# Patient Record
Sex: Female | Born: 1945 | Race: White | Hispanic: No | Marital: Married | Smoking: Never smoker
Health system: Southern US, Community
[De-identification: ages and names within clinical notes are randomized; demographics above are authoritative.]

## PROBLEM LIST (undated history)

## (undated) DIAGNOSIS — I10 Essential (primary) hypertension: Secondary | ICD-10-CM

## (undated) DIAGNOSIS — M810 Age-related osteoporosis without current pathological fracture: Secondary | ICD-10-CM

## (undated) DIAGNOSIS — H269 Unspecified cataract: Secondary | ICD-10-CM

## (undated) DIAGNOSIS — N183 Chronic kidney disease, stage 3 unspecified: Secondary | ICD-10-CM

## (undated) DIAGNOSIS — T7840XA Allergy, unspecified, initial encounter: Secondary | ICD-10-CM

## (undated) DIAGNOSIS — K219 Gastro-esophageal reflux disease without esophagitis: Secondary | ICD-10-CM

## (undated) DIAGNOSIS — E079 Disorder of thyroid, unspecified: Secondary | ICD-10-CM

## (undated) DIAGNOSIS — M1990 Unspecified osteoarthritis, unspecified site: Secondary | ICD-10-CM

## (undated) DIAGNOSIS — K635 Polyp of colon: Secondary | ICD-10-CM

## (undated) DIAGNOSIS — E785 Hyperlipidemia, unspecified: Secondary | ICD-10-CM

## (undated) HISTORY — DX: Essential (primary) hypertension: I10

## (undated) HISTORY — DX: Age-related osteoporosis without current pathological fracture: M81.0

## (undated) HISTORY — PX: UPPER GASTROINTESTINAL ENDOSCOPY: SHX188

## (undated) HISTORY — PX: POLYPECTOMY: SHX149

## (undated) HISTORY — DX: Unspecified osteoarthritis, unspecified site: M19.90

## (undated) HISTORY — DX: Polyp of colon: K63.5

## (undated) HISTORY — DX: Hyperlipidemia, unspecified: E78.5

## (undated) HISTORY — DX: Allergy, unspecified, initial encounter: T78.40XA

## (undated) HISTORY — DX: Gastro-esophageal reflux disease without esophagitis: K21.9

## (undated) HISTORY — DX: Unspecified cataract: H26.9

## (undated) HISTORY — DX: Chronic kidney disease, stage 3 (moderate): N18.3

## (undated) HISTORY — PX: COLONOSCOPY: SHX174

## (undated) HISTORY — DX: Disorder of thyroid, unspecified: E07.9

## (undated) HISTORY — PX: COLONOSCOPY W/ BIOPSIES: SHX1374

## (undated) HISTORY — DX: Chronic kidney disease, stage 3 unspecified: N18.30

## (undated) HISTORY — PX: OTHER SURGICAL HISTORY: SHX169

## (undated) HISTORY — PX: TUBAL LIGATION: SHX77

---

## 1995-03-04 HISTORY — PX: OTHER SURGICAL HISTORY: SHX169

## 2011-07-08 ENCOUNTER — Other Ambulatory Visit: Payer: Self-pay | Admitting: Family Medicine

## 2011-07-08 DIAGNOSIS — Z1231 Encounter for screening mammogram for malignant neoplasm of breast: Secondary | ICD-10-CM

## 2011-07-08 DIAGNOSIS — Z780 Asymptomatic menopausal state: Secondary | ICD-10-CM

## 2011-07-30 ENCOUNTER — Ambulatory Visit: Payer: Self-pay

## 2011-07-30 ENCOUNTER — Other Ambulatory Visit: Payer: Self-pay

## 2011-08-12 ENCOUNTER — Ambulatory Visit
Admission: RE | Admit: 2011-08-12 | Discharge: 2011-08-12 | Disposition: A | Discharge status: Home / Self Care | Payer: BC Managed Care – PPO | Source: Ambulatory Visit | Attending: Family Medicine | Admitting: Family Medicine

## 2011-08-12 DIAGNOSIS — Z780 Asymptomatic menopausal state: Secondary | ICD-10-CM

## 2011-08-12 DIAGNOSIS — Z1231 Encounter for screening mammogram for malignant neoplasm of breast: Secondary | ICD-10-CM

## 2013-10-21 ENCOUNTER — Other Ambulatory Visit: Payer: Self-pay

## 2013-10-21 DIAGNOSIS — Z1231 Encounter for screening mammogram for malignant neoplasm of breast: Secondary | ICD-10-CM

## 2013-11-11 ENCOUNTER — Ambulatory Visit: Payer: BC Managed Care – PPO

## 2013-12-15 ENCOUNTER — Ambulatory Visit
Admission: RE | Admit: 2013-12-15 | Discharge: 2013-12-15 | Disposition: A | Discharge status: Home / Self Care | Payer: 59 | Source: Ambulatory Visit

## 2013-12-15 DIAGNOSIS — Z1231 Encounter for screening mammogram for malignant neoplasm of breast: Secondary | ICD-10-CM

## 2015-09-10 ENCOUNTER — Other Ambulatory Visit: Payer: Self-pay | Admitting: Nurse Practitioner

## 2015-09-10 DIAGNOSIS — Z1231 Encounter for screening mammogram for malignant neoplasm of breast: Secondary | ICD-10-CM

## 2015-10-04 ENCOUNTER — Encounter: Payer: Self-pay | Admitting: Radiology

## 2015-10-04 ENCOUNTER — Ambulatory Visit
Admission: RE | Admit: 2015-10-04 | Discharge: 2015-10-04 | Disposition: A | Discharge status: Home / Self Care | Payer: Self-pay | Source: Ambulatory Visit | Attending: Nurse Practitioner | Admitting: Nurse Practitioner

## 2015-10-04 DIAGNOSIS — Z1231 Encounter for screening mammogram for malignant neoplasm of breast: Secondary | ICD-10-CM

## 2015-10-15 ENCOUNTER — Ambulatory Visit: Payer: Self-pay

## 2016-06-16 ENCOUNTER — Encounter: Payer: Self-pay | Admitting: Gastroenterology

## 2016-07-15 ENCOUNTER — Encounter: Payer: Self-pay | Admitting: Gastroenterology

## 2016-07-15 ENCOUNTER — Ambulatory Visit (INDEPENDENT_AMBULATORY_CARE_PROVIDER_SITE_OTHER): Payer: 59 | Admitting: Gastroenterology

## 2016-07-15 ENCOUNTER — Encounter (INDEPENDENT_AMBULATORY_CARE_PROVIDER_SITE_OTHER): Payer: Self-pay

## 2016-07-15 VITALS — BP 124/70 | HR 76 | Ht 60.25 in | Wt 156.8 lb

## 2016-07-15 DIAGNOSIS — Z800 Family history of malignant neoplasm of digestive organs: Secondary | ICD-10-CM

## 2016-07-15 DIAGNOSIS — R197 Diarrhea, unspecified: Secondary | ICD-10-CM | POA: Diagnosis not present

## 2016-07-15 NOTE — Patient Instructions (Signed)
If you are age 69 or older, your body mass index should be between 23-30. Your Body mass index is 30.37 kg/m. If this is out of the aforementioned range listed, please consider follow up with your Primary Care Provider.  If you are age 60 or younger, your body mass index should be between 19-25. Your Body mass index is 30.37 kg/m. If this is out of the aformentioned range listed, please consider follow up with your Primary Care Provider.   You will be due for a recall colonoscopy in 12-2016. We will send you a reminder in the mail when it gets closer to that time.  Thank you for choosing [facility]  Dr Wilfrid Lund III

## 2016-07-15 NOTE — Progress Notes (Addendum)
[facility] Consult Note:  History: Christina Bender 07/15/2016  Referring physician: Vicenta Aly, [place]  Reason for consult/chief complaint: Colon Cancer Screening (last done Oct 2013 with Dr Earlean Shawl, hx of benign polyps per pt) and Constipation   Subjective  HPI:  This is a 71 year old woman referred by primary care for recent diarrhea. When seen by them about a month ago, she reported 2 or 3 weeks of persistent loose stool that seemed to occur after her husband had also had a diarrheal illness. There was no rectal bleeding, she was having some crampy abdominal pain before BMs. Because of these symptoms and also a need for a screening colonoscopy later this year, the patient was referred to [place]. She now reports that her diarrhea and abdominal pain completely resolved. Her appetite is good and her weight stable. Her last colonoscopy was reportedly in October 2013 with Dr.Medoff, the report of which is not available today.  She does not know if there were polyps found, but she was told to return in 5 years because of her family history of colon cancer.  ROS:  Review of Systems  Constitutional: Negative for appetite change and unexpected weight change.  HENT: Negative for mouth sores and voice change.   Eyes: Negative for pain and redness.  Respiratory: Negative for cough and shortness of breath.   Cardiovascular: Negative for chest pain and palpitations.  Genitourinary: Negative for dysuria and hematuria.  Musculoskeletal: Negative for arthralgias and myalgias.  Skin: Negative for pallor and rash.  Allergic/Immunologic: Positive for environmental allergies.  Neurological: Negative for weakness and headaches.  Hematological: Negative for adenopathy.     Past Medical History: Past Medical History:  Diagnosis Date  . Chronic kidney disease (CKD), stage III (moderate)   . Colon polyps    benign  . Hyperlipidemia   . Hypertension   . Thyroid disorder       Past Surgical History: Past Surgical History:  Procedure Laterality Date  . cervical cone biospy  1997   benign  . COLONOSCOPY W/ BIOPSIES  2005, 2013     Family History: Family History  Problem Relation Age of Onset  . Colon cancer Mother        dx in her 15's  . Heart disease Mother   . Diabetes Mother   . Kidney disease Sister   . Kidney disease Brother   . Kidney disease Sister     Social History: Social History   Social History  . Marital status: Married    Spouse name: N/A  . Number of children: 1  . Years of education: N/A   Occupational History  . retired    Social History Main Topics  . Smoking status: Never Smoker  . Smokeless tobacco: Never Used  . Alcohol use No  . Drug use: No  . Sexual activity: Not Asked   Other Topics Concern  . None   Social History Narrative  . None    Allergies: No Known Allergies  Outpatient Meds: Current Outpatient Prescriptions  Medication Sig Dispense Refill  . atorvastatin (LIPITOR) 40 MG tablet Take 40 mg by mouth daily.    Marland Kitchen levothyroxine (SYNTHROID, LEVOTHROID) 100 MCG tablet Take 100 mcg by mouth daily before breakfast.    . lisinopril-hydrochlorothiazide (PRINZIDE,ZESTORETIC) 20-12.5 MG tablet Take 1 tablet by mouth daily.    . metoprolol tartrate (LOPRESSOR) 50 MG tablet Take 50 mg by mouth 2 (two) times daily.    . ranitidine (ZANTAC) 150 MG tablet Take 150  mg by mouth daily as needed for heartburn.     No current facility-administered medications for this visit.       ___________________________________________________________________ Objective   Exam:  BP 124/70   Pulse 76   Ht 5' 0.25" (1.53 m)   Wt 156 lb 12.8 oz (71.1 kg)   BMI 30.37 kg/m    General: this is a(n) Well-appearing woman   Eyes: sclera anicteric, no redness  ENT: oral mucosa moist without lesions, no cervical or supraclavicular lymphadenopathy, good dentition  CV: RRR without murmur, S1/S2, no JVD, no peripheral  edema  Resp: clear to auscultation bilaterally, normal RR and effort noted  GI: soft, no tenderness, with active bowel sounds. No guarding or palpable organomegaly noted.  Skin; warm and dry, no rash or jaundice noted  Neuro: awake, alert and oriented x 3. Normal gross motor function and fluent speech  Labs:  Primary care records reveal lab results from 06/12/2016: Normal CBC, normal CMP except creatinine 1.23   Assessment: Encounter Diagnoses  Name Primary?  . Diarrhea of presumed infectious origin Yes  . Family history of colon cancer in mother     Her recent abdominal pain and diarrhea have since resolved. It seems like she probably had a prolonged postinfectious diarrhea.  She is due for colonoscopy in October of this year at a 5 year interval from the last one because of her family history of colon cancer and a mother who was diagnosed under age 55.  Plan:  We have set assistant reminder to call her in several months to schedule a colonoscopy. She was also given my business card in case she has the need to see me sooner.  Thank you for the courtesy of this consult.  Please call me with any questions or concerns.  Nelida Meuse III  CC: Vicenta Aly, [place]   [facility] records show colonoscopy 12/25/11  Complete exam, excellent prep, at least one 43mm tubular adenoma, 2 diminutive polyps ablated, one or two other diminutive with "suggestive of early adenomatous change".  Recall for 12/2016 appropriate, which was the plan during recent office visit.  Wilfrid Lund, MD

## 2016-10-02 ENCOUNTER — Encounter: Payer: Self-pay | Admitting: Gastroenterology

## 2016-10-28 ENCOUNTER — Encounter: Payer: Self-pay | Admitting: Gastroenterology

## 2016-10-31 ENCOUNTER — Other Ambulatory Visit: Payer: Self-pay | Admitting: Nurse Practitioner

## 2016-10-31 DIAGNOSIS — M8580 Other specified disorders of bone density and structure, unspecified site: Secondary | ICD-10-CM

## 2016-10-31 DIAGNOSIS — Z1231 Encounter for screening mammogram for malignant neoplasm of breast: Secondary | ICD-10-CM

## 2016-11-14 ENCOUNTER — Ambulatory Visit: Payer: 59

## 2016-11-14 ENCOUNTER — Other Ambulatory Visit: Payer: 59

## 2016-11-25 ENCOUNTER — Ambulatory Visit
Admission: RE | Admit: 2016-11-25 | Discharge: 2016-11-25 | Disposition: A | Discharge status: Home / Self Care | Payer: 59 | Source: Ambulatory Visit | Attending: Nurse Practitioner | Admitting: Nurse Practitioner

## 2016-11-25 DIAGNOSIS — M8580 Other specified disorders of bone density and structure, unspecified site: Secondary | ICD-10-CM

## 2016-11-25 DIAGNOSIS — Z1231 Encounter for screening mammogram for malignant neoplasm of breast: Secondary | ICD-10-CM

## 2016-12-12 ENCOUNTER — Ambulatory Visit (AMBULATORY_SURGERY_CENTER): Payer: Self-pay

## 2016-12-12 ENCOUNTER — Encounter: Payer: Self-pay | Admitting: Gastroenterology

## 2016-12-12 VITALS — Ht 61.0 in | Wt 154.8 lb

## 2016-12-12 DIAGNOSIS — Z860100 Personal history of colon polyps, unspecified: Secondary | ICD-10-CM

## 2016-12-12 MED ORDER — SUPREP BOWEL PREP KIT 17.5-3.13-1.6 GM/177ML PO SOLN: 1.0000 | Freq: Once | ORAL | 0 refills | Status: AC

## 2016-12-12 NOTE — Progress Notes (Signed)
No diet meds No allergies to eggs or soy No home oxygen No past problems with anesthesia  Declined emmi 

## 2016-12-26 ENCOUNTER — Encounter: Payer: 59 | Admitting: Gastroenterology

## 2017-01-28 ENCOUNTER — Telehealth: Payer: Self-pay | Admitting: Gastroenterology

## 2017-01-29 ENCOUNTER — Telehealth: Payer: Self-pay | Admitting: Gastroenterology

## 2017-01-29 ENCOUNTER — Encounter: Payer: 59 | Admitting: Gastroenterology

## 2017-01-29 NOTE — Telephone Encounter (Signed)
Yes, thank you.  Dr. Silverio Decamp heard from patient and let me know.  I have sent a message to Almyra Free about it as well.

## 2017-01-29 NOTE — Telephone Encounter (Signed)
Sorry to hear she had such trouble.  Christina Bender, Please contact patient and see if she would like to try another time with a Ducolax/Miralax prep.  - HD

## 2017-01-29 NOTE — Telephone Encounter (Signed)
Oncall Note Patient's husband called first to discuss his concerns regarding the prep as she has CKD, reassured patient and her husband and advised to proceed with prep as instructed.  I got a call back in 1 hour by husband saying that patient didn't tolerate the prep and she vomited everything. Didn't want to proceed with colonoscopy, will call back to reschedule. Refused antiemetics.

## 2017-01-30 NOTE — Telephone Encounter (Signed)
Left message for patient today, asked her to call back to let us know if she is interested in rescheduling colonoscopy, would change to a different prep.

## 2017-02-04 NOTE — Telephone Encounter (Signed)
I have not heard back from patient re: rescheduling colonoscopy, mailed follow up letter today. Asked to call office if she is interested in scheduling colonoscopy with different prep.

## 2017-03-20 ENCOUNTER — Encounter: Payer: Self-pay | Admitting: *Deleted

## 2017-03-20 ENCOUNTER — Ambulatory Visit (AMBULATORY_SURGERY_CENTER): Payer: Self-pay | Admitting: *Deleted

## 2017-03-20 ENCOUNTER — Other Ambulatory Visit: Payer: Self-pay

## 2017-03-20 ENCOUNTER — Telehealth: Payer: Self-pay | Admitting: *Deleted

## 2017-03-20 VITALS — Ht 62.0 in | Wt 157.0 lb

## 2017-03-20 DIAGNOSIS — Z800 Family history of malignant neoplasm of digestive organs: Secondary | ICD-10-CM

## 2017-03-20 DIAGNOSIS — Z86010 Personal history of colonic polyps: Secondary | ICD-10-CM

## 2017-03-20 MED ORDER — NA SULFATE-K SULFATE-MG SULF 17.5-3.13-1.6 GM/177ML PO SOLN: 1.0000 [IU] | Freq: Once | ORAL | 0 refills | Status: AC

## 2017-03-20 NOTE — Telephone Encounter (Signed)
Called [facility] and spoke with Maudie Mercury.  Cancelled patient's Rx. For Suprep as she was changed to Miralax prep.  Patient could not tolerate the Suprep and was rescheduled.  Patient states that Almyra Free said we could give her Miralax prep instead.  I did not see anything stating this but I changed prep to Miralax after Suprep was already sent.  B.Simuel Stebner, CMA  PV

## 2017-03-20 NOTE — Progress Notes (Signed)
No egg or soy allergy known to patient  No issues with past sedation with any surgeries  or procedures, no intubation problems  No diet pills per patient No home 02 use per patient  No blood thinners per patient  Pt denies issues with constipation  No A fib or A flutter  EMMI video sent to pt's e mail pt declined   

## 2017-03-26 ENCOUNTER — Encounter: Payer: Self-pay | Admitting: Gastroenterology

## 2017-04-09 ENCOUNTER — Other Ambulatory Visit: Payer: Self-pay

## 2017-04-09 ENCOUNTER — Ambulatory Visit (AMBULATORY_SURGERY_CENTER): Payer: 59 | Admitting: Gastroenterology

## 2017-04-09 ENCOUNTER — Encounter: Payer: Self-pay | Admitting: Gastroenterology

## 2017-04-09 VITALS — BP 103/54 | HR 52 | Temp 97.8°F | Resp 14 | Ht 62.0 in | Wt 157.0 lb

## 2017-04-09 DIAGNOSIS — D123 Benign neoplasm of transverse colon: Secondary | ICD-10-CM | POA: Diagnosis not present

## 2017-04-09 DIAGNOSIS — Z86010 Personal history of colonic polyps: Secondary | ICD-10-CM | POA: Diagnosis not present

## 2017-04-09 MED ORDER — SODIUM CHLORIDE 0.9 % IV SOLN: 500.0000 mL | INTRAVENOUS | Status: DC

## 2017-04-09 MED ORDER — FLEET ENEMA 7-19 GM/118ML RE ENEM
1.0000 | ENEMA | Freq: Once | RECTAL | Status: AC
  Administered 2017-04-09: 1 via RECTAL

## 2017-04-09 NOTE — Progress Notes (Signed)
Report given to PACU, vss 

## 2017-04-09 NOTE — Patient Instructions (Signed)
YOU HAD AN ENDOSCOPIC PROCEDURE TODAY AT [facility]:   Refer to the procedure report that was given to you for any specific questions about what was found during the examination.  If the procedure report does not answer your questions, please call your gastroenterologist to clarify.  If you requested that your care partner not be given the details of your procedure findings, then the procedure report has been included in a sealed envelope for you to review at your convenience later.  YOU SHOULD EXPECT: Some feelings of bloating in the abdomen. Passage of more gas than usual.  Walking can help get rid of the air that was put into your GI tract during the procedure and reduce the bloating. If you had a lower endoscopy (such as a colonoscopy or flexible sigmoidoscopy) you may notice spotting of blood in your stool or on the toilet paper. If you underwent a bowel prep for your procedure, you may not have a normal bowel movement for a few days.  Please Note:  You might notice some irritation and congestion in your nose or some drainage.  This is from the oxygen used during your procedure.  There is no need for concern and it should clear up in a day or so.  SYMPTOMS TO REPORT IMMEDIATELY:   Following lower endoscopy (colonoscopy or flexible sigmoidoscopy):  Excessive amounts of blood in the stool  Significant tenderness or worsening of abdominal pains  Swelling of the abdomen that is new, acute  Fever of 100F or higher   For urgent or emergent issues, a gastroenterologist can be reached at any hour by calling [phone].   DIET:  We do recommend a small meal at first, but then you may proceed to your regular diet.  Drink plenty of fluids but you should avoid alcoholic beverages for 24 hours.  ACTIVITY:  You should plan to take it easy for the rest of today and you should NOT DRIVE or use heavy machinery until tomorrow (because of the sedation medicines used during the test).     FOLLOW UP: Our staff will call the number listed on your records the next business day following your procedure to check on you and address any questions or concerns that you may have regarding the information given to you following your procedure. If we do not reach you, we will leave a message.  However, if you are feeling well and you are not experiencing any problems, there is no need to return our call.  We will assume that you have returned to your regular daily activities without incident.  If any biopsies were taken you will be contacted by phone or by letter within the next 1-3 weeks.  Please call us at [phone] if you have not heard about the biopsies in 3 weeks.    SIGNATURES/CONFIDENTIALITY: You and/or your care partner have signed paperwork which will be entered into your electronic medical record.  These signatures attest to the fact that that the information above on your After Visit Summary has been reviewed and is understood.  Full responsibility of the confidentiality of this discharge information lies with you and/or your care-partner.  We will see you for another colonoscopy in 5 years due to your Mom's history.

## 2017-04-09 NOTE — Progress Notes (Signed)
Enema results yellow translucent liquid. Informed Dr. Loletha Carrow.

## 2017-04-09 NOTE — Progress Notes (Addendum)
Pt's states no medical or surgical changes since previsit or office visit.   Patient stating she did vomit after completing evening prep. Patient's husband called Dr Christina Bender last evening. Dr Christina Bender stating she may need an enema. Patient completed the prep with brown results. Discussed with Dr. Loletha Carrow, enema ordered.

## 2017-04-09 NOTE — Op Note (Signed)
[facility] Patient Name: Christina Bender Procedure Date: 04/09/2017 8:38 AM MRN: [id number] Endoscopist: [place]. Loletha Carrow , MD Age: 72 Referring MD:  Date of Birth: [birth year] Gender: Female Account #: [id number] Procedure:                Colonoscopy Indications:              Surveillance: Personal history of adenomatous                            polyps on last colonoscopy 5 years ago (11mm TA                            12/2011) Medicines:                Monitored Anesthesia Care Procedure:                Pre-Anesthesia Assessment:                           - Prior to the procedure, a History and Physical                            was performed, and patient medications and                            allergies were reviewed. The patient's tolerance of                            previous anesthesia was also reviewed. The risks                            and benefits of the procedure and the sedation                            options and risks were discussed with the patient.                            All questions were answered, and informed consent                            was obtained. Prior Anticoagulants: The patient has                            taken no previous anticoagulant or antiplatelet                            agents. ASA Grade Assessment: II - A patient with                            mild systemic disease. After reviewing the risks                            and benefits, the patient was deemed in  satisfactory condition to undergo the procedure.                           After obtaining informed consent, the colonoscope                            was passed under direct vision. Throughout the                            procedure, the patient's blood pressure, pulse, and                            oxygen saturations were monitored continuously. The                            Model CF-HQ190L [id number] [id number]) scope was introduced                  through the anus and advanced to the the cecum,                            identified by appendiceal orifice and ileocecal                            valve. The colonoscopy was performed without                            difficulty. The patient tolerated the procedure                            well. The quality of the bowel preparation was                            good. The ileocecal valve, appendiceal orifice, and                            rectum were photographed. The quality of the bowel                            preparation was evaluated using the BBPS [facility]                            Bowel Preparation Scale) with scores of: Right                            Colon = 2, Transverse Colon = 3 and Left Colon = 2.                            The total BBPS score equals 7. After lavage. The                            bowel preparation used was Ducolax/Miralax (patient  previously did not tolerate Suprep). Scope In: 8:49:49 AM Scope Out: 9:04:21 AM Scope Withdrawal Time: 0 hours 11 minutes 53 seconds  Total Procedure Duration: 0 hours 14 minutes 32 seconds  Findings:                 The perianal and digital rectal examinations were                            normal.                           A 4 mm polyp was found in the mid transverse colon.                            The polyp was sessile. The polyp was removed with a                            cold snare. Resection and retrieval were complete.                           Retroflexion in the rectum was not performed due to                            anatomy.                           The exam was otherwise without abnormality. Complications:            No immediate complications. Estimated Blood Loss:     Estimated blood loss was minimal. Impression:               - One 4 mm polyp in the mid transverse colon,                            removed with a cold snare. Resected and retrieved.                            - The examination was otherwise normal. Recommendation:           - Patient has a contact number available for                            emergencies. The signs and symptoms of potential                            delayed complications were discussed with the                            patient. Return to normal activities tomorrow.                            Written discharge instructions were provided to the                            patient.                           -  Resume previous diet.                           - Continue present medications.                           - Await pathology results.                           - Repeat colonoscopy in 5 years (even if polyp not                            adenomatous, patient has a FAMILY HISTORY OF COLON                            CANCER IN HER MOTHER) . Henry L. Loletha Carrow, MD 04/09/2017 9:10:45 AM This report has been signed electronically.

## 2017-04-09 NOTE — Progress Notes (Signed)
Called to room to assist during endoscopic procedure.  Patient ID and intended procedure confirmed with present staff. Received instructions for my participation in the procedure from the performing physician.  

## 2017-04-10 ENCOUNTER — Telehealth: Payer: Self-pay | Admitting: *Deleted

## 2017-04-10 NOTE — Telephone Encounter (Signed)
  Follow up Call-  Call back number 04/09/2017  Post procedure Call Back phone  # [phone]  Permission to leave phone message Yes  Some recent data might be hidden     Patient questions:  Do you have a fever, pain , or abdominal swelling? No. Pain Score  0 *  Have you tolerated food without any problems? Yes.    Have you been able to return to your normal activities? Yes.    Do you have any questions about your discharge instructions: Diet   No. Medications  No. Follow up visit  No.  Do you have questions or concerns about your Care? No.  Actions: * If pain score is 4 or above: No action needed, pain <4.

## 2017-04-14 ENCOUNTER — Encounter: Payer: Self-pay | Admitting: Gastroenterology

## 2017-10-26 ENCOUNTER — Other Ambulatory Visit: Payer: Self-pay | Admitting: Nurse Practitioner

## 2017-10-26 DIAGNOSIS — Z1231 Encounter for screening mammogram for malignant neoplasm of breast: Secondary | ICD-10-CM

## 2017-11-30 ENCOUNTER — Ambulatory Visit
Admission: RE | Admit: 2017-11-30 | Discharge: 2017-11-30 | Disposition: A | Discharge status: Home / Self Care | Payer: 59 | Source: Ambulatory Visit | Attending: Nurse Practitioner | Admitting: Nurse Practitioner

## 2017-11-30 DIAGNOSIS — Z1231 Encounter for screening mammogram for malignant neoplasm of breast: Secondary | ICD-10-CM

## 2018-01-05 ENCOUNTER — Encounter: Payer: Self-pay | Admitting: Gastroenterology

## 2018-01-05 ENCOUNTER — Ambulatory Visit (INDEPENDENT_AMBULATORY_CARE_PROVIDER_SITE_OTHER): Payer: 59 | Admitting: Gastroenterology

## 2018-01-05 VITALS — BP 130/68 | HR 60 | Ht 62.0 in | Wt 144.0 lb

## 2018-01-05 DIAGNOSIS — R195 Other fecal abnormalities: Secondary | ICD-10-CM

## 2018-01-05 NOTE — Progress Notes (Signed)
      GI Progress Note  Chief Complaint: Heme positive stool  Subjective  History:  Christina Bender sees me at the request of primary care for 2 separate stool cards positive for occult blood.  These were apparently done for colon cancer screening at the time of recent routine health maintenance physical, though she had had a colonoscopy with me earlier this year. Christina Bender denies heartburn, dysphagia, nausea, vomiting, early satiety or weight loss.  She denies change in bowel habits or rectal bleeding.  Colonoscopy Feb 2019 with < 78mm tubular adenoma  ROS: Cardiovascular:  no chest pain Respiratory: no dyspnea  The patient's Past Medical, Family and Social History were reviewed and are on file in the EMR.  Objective:  Med list reviewed  Current Outpatient Medications:  .  acetaminophen (TYLENOL) 500 MG tablet, Take 500 mg by mouth every 6 (six) hours as needed., Disp: , Rfl:  .  atorvastatin (LIPITOR) 40 MG tablet, Take 40 mg by mouth daily., Disp: , Rfl:  .  cetirizine (ZYRTEC) 10 MG tablet, Take 10 mg by mouth as needed for allergies., Disp: , Rfl:  .  cholecalciferol (VITAMIN D) 1000 units tablet, Take 1,000 Units by mouth daily., Disp: , Rfl:  .  Diclofenac Sodium (PENNSAID TD), Place onto the skin. Knee pain This med is liquid, Disp: , Rfl:  .  levothyroxine (SYNTHROID, LEVOTHROID) 100 MCG tablet, Take 100 mcg by mouth daily before breakfast., Disp: , Rfl:  .  lisinopril-hydrochlorothiazide (PRINZIDE,ZESTORETIC) 20-12.5 MG tablet, Take 1 tablet by mouth daily., Disp: , Rfl:  .  metoprolol tartrate (LOPRESSOR) 50 MG tablet, Take 50 mg by mouth 2 (two) times daily., Disp: , Rfl:  .  Multiple Vitamin (MULTIVITAMIN) tablet, Take 1 tablet by mouth daily., Disp: , Rfl:  .  naproxen sodium (ANAPROX) 220 MG tablet, Take 220 mg by mouth 2 (two) times daily with a meal. Once weekly, Disp: , Rfl:  .  ranitidine (ZANTAC) 150 MG tablet, Take 150 mg by mouth daily as needed for heartburn.,  Disp: , Rfl:   Current Facility-Administered Medications:  .  0.9 %  sodium chloride infusion, 500 mL, Intravenous, Continuous, Danis, Estill Cotta III, MD   Vital signs in last 24 hrs: Vitals:   01/05/18 1324  BP: 130/68  Pulse: 60    Physical Exam    HEENT: sclera anicteric, oral mucosa moist without lesions  Neck: supple, no thyromegaly, JVD or lymphadenopathy  Cardiac: RRR without murmurs, S1S2 heard, no peripheral edema  Pulm: clear to auscultation bilaterally, normal RR and effort noted  Abdomen: soft, no tenderness, with active bowel sounds. No guarding or palpable hepatosplenomegaly.  Skin; warm and dry, no jaundice or rash  Recent Labs:  Normal CBC 10/30/17 Pos stool card 11/04/17   @ASSESSMENTPLANBEGIN @ Assessment: Encounter Diagnosis  Name Primary?  . Heme positive stool Yes    Plan: Heme positive stool without localizing symptoms or anemia.  We discussed how this could be false positive, but we really should assume there is a potential upper GI source. I recommended upper endoscopy, and she is agreeable after discussion of procedure and risks.  Going forward, she does not need stool heme testing at routine health physicals.   Total time 25 minutes, over half spent face-to-face with patient in counseling and coordination of care.   Nelida Meuse III

## 2018-01-05 NOTE — Patient Instructions (Signed)
If you are age 70 or older, your body mass index should be between 23-30. Your Body mass index is 26.34 kg/m. If this is out of the aforementioned range listed, please consider follow up with your Primary Care Provider.  If you are age 28 or younger, your body mass index should be between 19-25. Your Body mass index is 26.34 kg/m. If this is out of the aformentioned range listed, please consider follow up with your Primary Care Provider.   You have been scheduled for an endoscopy. Please follow written instructions given to you at your visit today. If you use inhalers (even only as needed), please bring them with you on the day of your procedure. Your physician has requested that you go to www.startemmi.com and enter the access code given to you at your visit today. This web site gives a general overview about your procedure. However, you should still follow specific instructions given to you by our office regarding your preparation for the procedure.  It was a pleasure to see you today!  Dr. Loletha Carrow

## 2018-01-12 ENCOUNTER — Telehealth: Payer: Self-pay | Admitting: Gastroenterology

## 2018-01-12 ENCOUNTER — Encounter: Payer: Medicare Other | Admitting: Gastroenterology

## 2018-01-12 NOTE — Telephone Encounter (Signed)
Patient woke up this am with a scratchy throat and congestion she asked to reschedule her EGD for next week on 01/22/18 Sm

## 2018-01-22 ENCOUNTER — Ambulatory Visit (AMBULATORY_SURGERY_CENTER): Payer: Medicare Other | Admitting: Gastroenterology

## 2018-01-22 ENCOUNTER — Encounter: Payer: Self-pay | Admitting: Gastroenterology

## 2018-01-22 ENCOUNTER — Encounter: Payer: Medicare Other | Admitting: Gastroenterology

## 2018-01-22 VITALS — BP 120/57 | HR 60 | Temp 97.7°F | Resp 16 | Ht 62.0 in | Wt 144.0 lb

## 2018-01-22 DIAGNOSIS — R195 Other fecal abnormalities: Secondary | ICD-10-CM

## 2018-01-22 MED ORDER — SODIUM CHLORIDE 0.9 % IV SOLN: 500.0000 mL | Freq: Once | INTRAVENOUS | Status: DC

## 2018-01-22 NOTE — Progress Notes (Signed)
PT taken to PACU. Monitors in place. VSS. Report given to RN. 

## 2018-01-22 NOTE — Op Note (Signed)
[facility] Patient Name: Christina Bender Procedure Date: 01/22/2018 10:13 AM MRN: [id number] Endoscopist: [place]. Loletha Carrow , MD Age: 72 Referring MD:  Date of Birth: 1945/05/20 Gender: Female Account #: [id number] Procedure:                Upper GI endoscopy Indications:              Heme positive stool (done at routine physical -                            screening colonoscopy earlier this year with <39mm                            tubular adneoma. normal hemoglobin, no chronic                            digestive symptoms) Medicines:                Monitored Anesthesia Care Procedure:                Pre-Anesthesia Assessment:                           - Prior to the procedure, a History and Physical                            was performed, and patient medications and                            allergies were reviewed. The patient's tolerance of                            previous anesthesia was also reviewed. The risks                            and benefits of the procedure and the sedation                            options and risks were discussed with the patient.                            All questions were answered, and informed consent                            was obtained. Prior Anticoagulants: The patient has                            taken no previous anticoagulant or antiplatelet                            agents. ASA Grade Assessment: II - A patient with                            mild systemic disease. After reviewing the risks  and benefits, the patient was deemed in                            satisfactory condition to undergo the procedure.                           After obtaining informed consent, the endoscope was                            passed under direct vision. Throughout the                            procedure, the patient's blood pressure, pulse, and                            oxygen saturations were monitored  continuously. The                            Endoscope was introduced through the mouth, and                            advanced to the third part of duodenum. The upper                            GI endoscopy was accomplished without difficulty.                            The patient tolerated the procedure well. Scope In: Scope Out: Findings:                 The esophagus was normal.                           The stomach was normal.                           The examined duodenum was normal. Complications:            No immediate complications. Estimated Blood Loss:     Estimated blood loss: none. Impression:               - Normal esophagus.                           - Normal stomach.                           - Normal examined duodenum.                           - No specimens collected.                           Apparent false positive stool test for occult blood. Recommendation:           - Patient has a contact number available for  emergencies. The signs and symptoms of potential                            delayed complications were discussed with the                            patient. Return to normal activities tomorrow.                            Written discharge instructions were provided to the                            patient.                           - Resume previous diet.                           - Continue present medications. Tramon Crescenzo L. Loletha Carrow, MD 01/22/2018 10:27:27 AM This report has been signed electronically.

## 2018-01-22 NOTE — Patient Instructions (Signed)
Impression/Recommendations:  Resume previous diet. Continue present medications.  YOU HAD AN ENDOSCOPIC PROCEDURE TODAY AT [facility]:   Refer to the procedure report that was given to you for any specific questions about what was found during the examination.  If the procedure report does not answer your questions, please call your gastroenterologist to clarify.  If you requested that your care partner not be given the details of your procedure findings, then the procedure report has been included in a sealed envelope for you to review at your convenience later.  YOU SHOULD EXPECT: Some feelings of bloating in the abdomen. Passage of more gas than usual.  Walking can help get rid of the air that was put into your GI tract during the procedure and reduce the bloating. If you had a lower endoscopy (such as a colonoscopy or flexible sigmoidoscopy) you may notice spotting of blood in your stool or on the toilet paper. If you underwent a bowel prep for your procedure, you may not have a normal bowel movement for a few days.  Please Note:  You might notice some irritation and congestion in your nose or some drainage.  This is from the oxygen used during your procedure.  There is no need for concern and it should clear up in a day or so.  SYMPTOMS TO REPORT IMMEDIATELY:  Following upper endoscopy (EGD)  Vomiting of blood or coffee ground material  New chest pain or pain under the shoulder blades  Painful or persistently difficult swallowing  New shortness of breath  Fever of 100F or higher  Black, tarry-looking stools  For urgent or emergent issues, a gastroenterologist can be reached at any hour by calling [phone].   DIET:  We do recommend a small meal at first, but then you may proceed to your regular diet.  Drink plenty of fluids but you should avoid alcoholic beverages for 24 hours.  ACTIVITY:  You should plan to take it easy for the rest of today and you should NOT  DRIVE or use heavy machinery until tomorrow (because of the sedation medicines used during the test).    FOLLOW UP: Our staff will call the number listed on your records the next business day following your procedure to check on you and address any questions or concerns that you may have regarding the information given to you following your procedure. If we do not reach you, we will leave a message.  However, if you are feeling well and you are not experiencing any problems, there is no need to return our call.  We will assume that you have returned to your regular daily activities without incident.  If any biopsies were taken you will be contacted by phone or by letter within the next 1-3 weeks.  Please call us at [phone] if you have not heard about the biopsies in 3 weeks.    SIGNATURES/CONFIDENTIALITY: You and/or your care partner have signed paperwork which will be entered into your electronic medical record.  These signatures attest to the fact that that the information above on your After Visit Summary has been reviewed and is understood.  Full responsibility of the confidentiality of this discharge information lies with you and/or your care-partner.

## 2018-01-25 ENCOUNTER — Telehealth: Payer: Self-pay | Admitting: *Deleted

## 2018-01-25 NOTE — Telephone Encounter (Signed)
  Follow up Call-  Call back number 01/22/2018 04/09/2017  Post procedure Call Back phone  # [phone]  Permission to leave phone message Yes Yes  Some recent data might be hidden     Patient questions:  Do you have a fever, pain , or abdominal swelling? No. Pain Score  0 *  Have you tolerated food without any problems? Yes.    Have you been able to return to your normal activities? Yes.    Do you have any questions about your discharge instructions: Diet   No. Medications  No. Follow up visit  No.  Do you have questions or concerns about your Care? No.  Actions: * If pain score is 4 or above: No action needed, pain <4.

## 2018-10-21 ENCOUNTER — Other Ambulatory Visit: Payer: Self-pay | Admitting: Nurse Practitioner

## 2018-10-21 DIAGNOSIS — Z1231 Encounter for screening mammogram for malignant neoplasm of breast: Secondary | ICD-10-CM

## 2018-12-08 ENCOUNTER — Ambulatory Visit: Payer: Medicare Other

## 2019-05-27 ENCOUNTER — Ambulatory Visit
Admission: RE | Admit: 2019-05-27 | Discharge: 2019-05-27 | Disposition: A | Discharge status: Home / Self Care | Payer: Medicare Other | Source: Ambulatory Visit | Attending: Nurse Practitioner | Admitting: Nurse Practitioner

## 2019-05-27 ENCOUNTER — Other Ambulatory Visit: Payer: Self-pay

## 2019-05-27 DIAGNOSIS — Z1231 Encounter for screening mammogram for malignant neoplasm of breast: Secondary | ICD-10-CM

## 2020-05-28 ENCOUNTER — Other Ambulatory Visit: Payer: Self-pay | Admitting: Nurse Practitioner

## 2020-05-28 DIAGNOSIS — Z1231 Encounter for screening mammogram for malignant neoplasm of breast: Secondary | ICD-10-CM

## 2020-06-16 IMAGING — MG DIGITAL SCREENING BILATERAL MAMMOGRAM WITH TOMO AND CAD
6 of 10 series · 6 of 30 positions shown · non-contrast
Comparison: Previous exam(s).

CLINICAL DATA: Screening.

EXAM:
DIGITAL SCREENING BILATERAL MAMMOGRAM WITH TOMO AND CAD

[L CC synth-2D (1 of 2)]
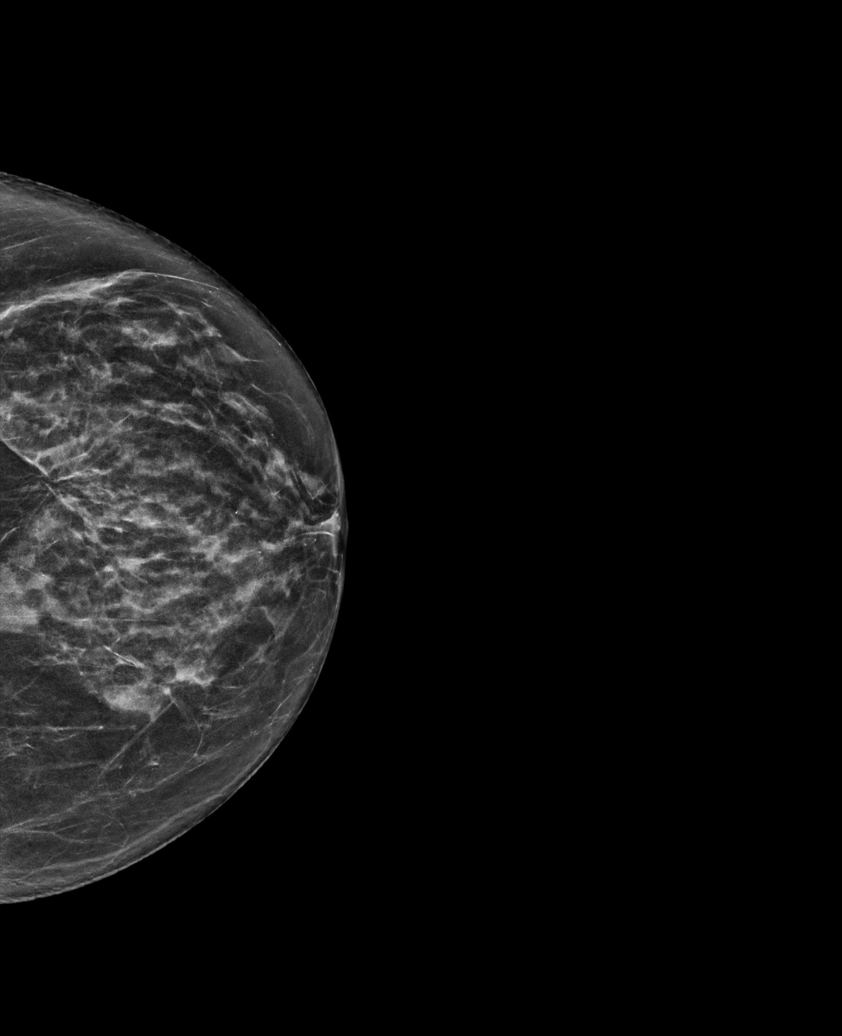

[L MLO synth-2D]
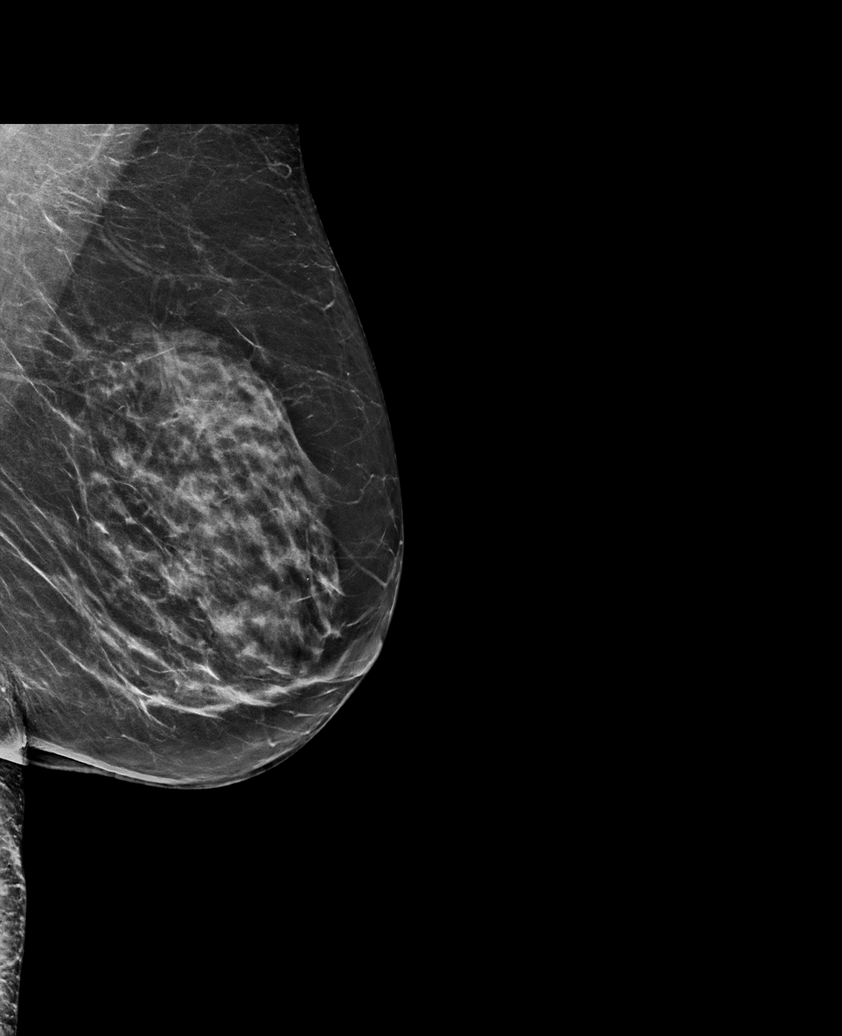

[L CC synth-2D (2 of 2)]
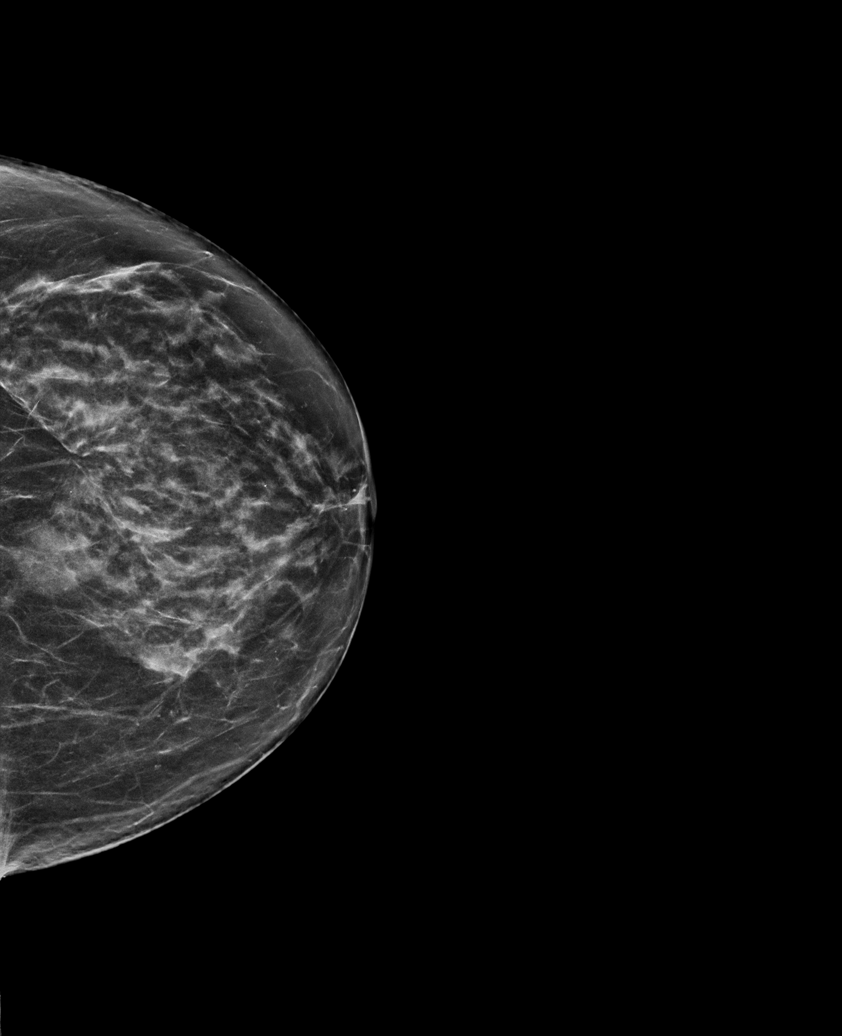

[R CC synth-2D]
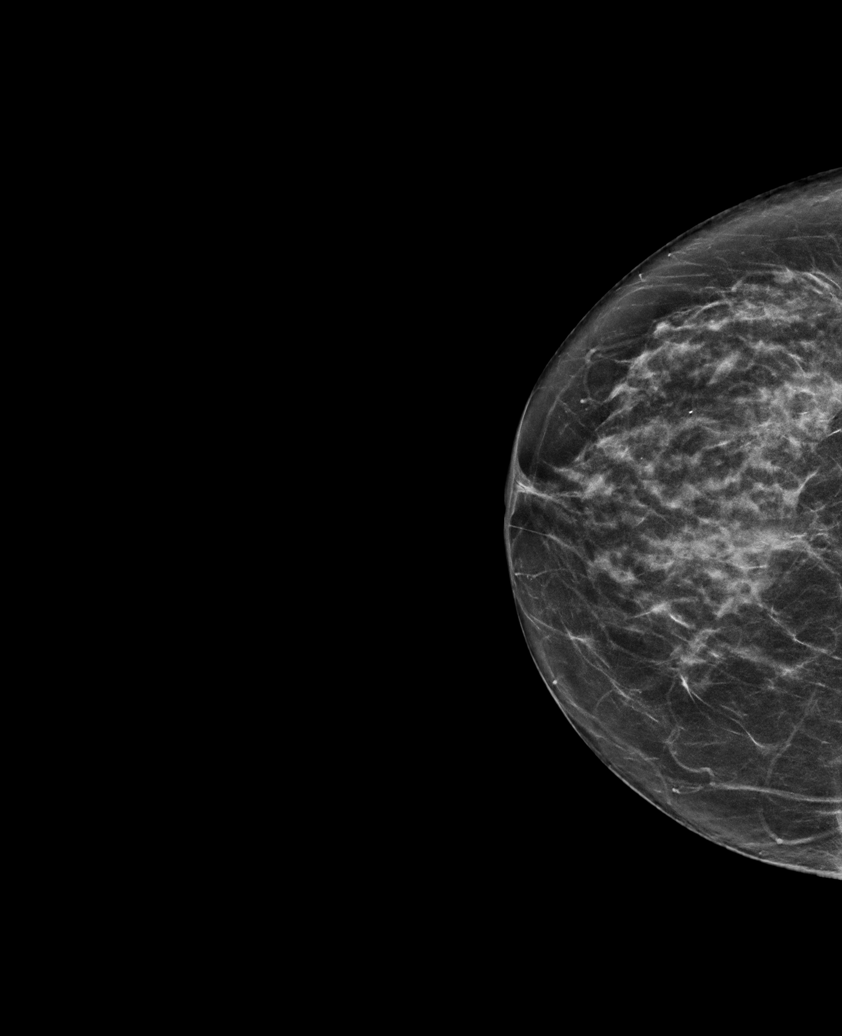

[R MLO synth-2D]
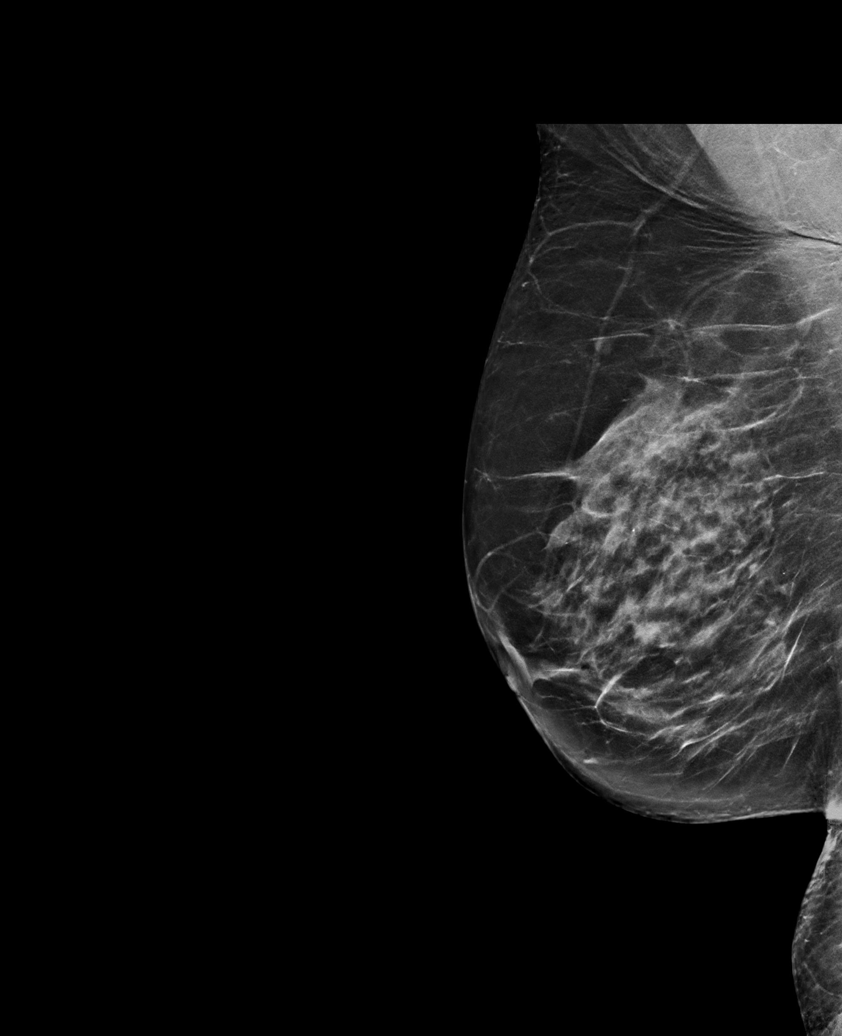

[R MLO tomo · tomo slice 37/73.0]
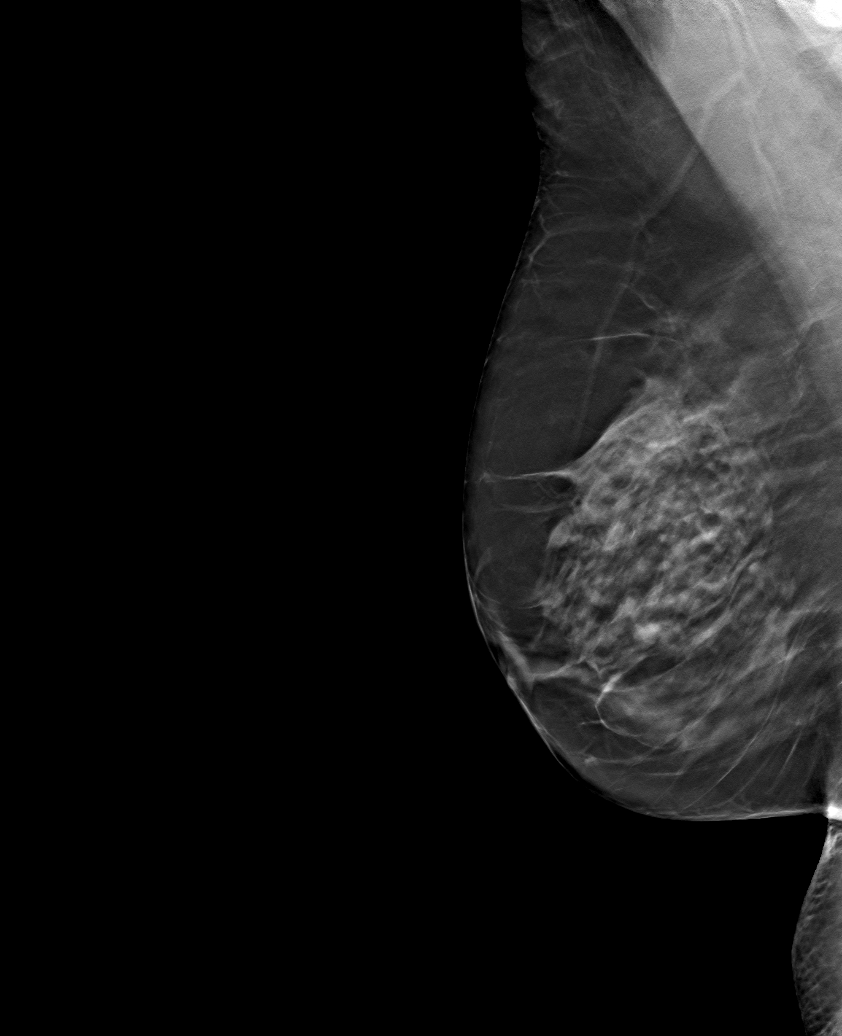

[6 of 30 positions shown; findings below may reference images not displayed]

ACR Breast Density Category c: The breast tissue is heterogeneously
dense, which may obscure small masses.
FINDINGS: There are no findings suspicious for malignancy. Images were
processed with CAD.
IMPRESSION: No mammographic evidence of malignancy. A result letter of this
screening mammogram will be mailed directly to the patient.

RECOMMENDATION:
Screening mammogram in one year. (Code:FT-U-LHB)

BI-RADS CATEGORY  1: Negative.

## 2020-07-10 ENCOUNTER — Other Ambulatory Visit: Payer: Self-pay

## 2020-07-10 ENCOUNTER — Ambulatory Visit
Admission: RE | Admit: 2020-07-10 | Discharge: 2020-07-10 | Disposition: A | Discharge status: Home / Self Care | Payer: Medicare Other | Source: Ambulatory Visit

## 2020-07-10 DIAGNOSIS — Z1231 Encounter for screening mammogram for malignant neoplasm of breast: Secondary | ICD-10-CM

## 2021-07-09 ENCOUNTER — Other Ambulatory Visit: Payer: Self-pay | Admitting: Nurse Practitioner

## 2021-07-09 DIAGNOSIS — Z1231 Encounter for screening mammogram for malignant neoplasm of breast: Secondary | ICD-10-CM

## 2021-07-24 ENCOUNTER — Ambulatory Visit: Payer: Medicare Other

## 2021-07-26 ENCOUNTER — Ambulatory Visit
Admission: RE | Admit: 2021-07-26 | Discharge: 2021-07-26 | Disposition: A | Discharge status: Home / Self Care | Payer: Medicare Other | Source: Ambulatory Visit | Attending: Nurse Practitioner | Admitting: Nurse Practitioner

## 2021-07-26 DIAGNOSIS — Z1231 Encounter for screening mammogram for malignant neoplasm of breast: Secondary | ICD-10-CM

## 2021-07-31 ENCOUNTER — Other Ambulatory Visit: Payer: Self-pay | Admitting: Nurse Practitioner

## 2021-07-31 DIAGNOSIS — R928 Other abnormal and inconclusive findings on diagnostic imaging of breast: Secondary | ICD-10-CM

## 2021-08-08 ENCOUNTER — Ambulatory Visit
Admission: RE | Admit: 2021-08-08 | Discharge: 2021-08-08 | Disposition: A | Discharge status: Home / Self Care | Payer: Medicare Other | Source: Ambulatory Visit | Attending: Nurse Practitioner | Admitting: Nurse Practitioner

## 2021-08-08 DIAGNOSIS — R928 Other abnormal and inconclusive findings on diagnostic imaging of breast: Secondary | ICD-10-CM

## 2022-03-17 ENCOUNTER — Encounter: Payer: Self-pay | Admitting: Gastroenterology

## 2022-03-24 ENCOUNTER — Encounter: Payer: Self-pay | Admitting: Gastroenterology

## 2022-04-17 ENCOUNTER — Ambulatory Visit: Payer: Medicare Other | Admitting: *Deleted

## 2022-04-17 VITALS — Ht 62.0 in | Wt 164.0 lb

## 2022-04-17 DIAGNOSIS — Z860100 Personal history of colon polyps, unspecified: Secondary | ICD-10-CM

## 2022-04-17 MED ORDER — ONDANSETRON HCL 4 MG PO TABS: 4.0000 mg | ORAL_TABLET | Freq: Three times a day (TID) | ORAL | 1 refills | Status: AC | PRN

## 2022-04-17 NOTE — Progress Notes (Signed)
No egg or soy allergy known to patient  No issues known to pt with past sedation with any surgeries or procedures Patient denies ever being told they had issues or difficulty with intubation  No FH of Malignant Hyperthermia Pt is not on diet pills Pt is not on  home 02  Pt is not on blood thinners  Pt denies issues with constipation  Pt is not on dialysis Pt denies any upcoming cardiac testing Pt encouraged to use to use Singlecare or Goodrx to reduce co Patient's chart reviewed by Osvaldo Angst CNRA prior to previsit and patient appropriate for the [place].  Previsit completed and red dot placed by patient's name on their procedure day (on provider's schedule).  . Visit in person with spouse Instructions reviewed with both pt and husband Pt states she understood instructions  Stated even doing the Miralax last time she had nausea from having to drink so much in a short time. Zofran ordered and pt given instructions to take 1 hour to 30 minutes prior to drinking the prep. Instructions given  to pt and sent by my  chart

## 2022-04-29 ENCOUNTER — Encounter: Payer: Self-pay | Admitting: Gastroenterology

## 2022-05-13 ENCOUNTER — Ambulatory Visit (AMBULATORY_SURGERY_CENTER): Payer: Medicare Other | Admitting: Gastroenterology

## 2022-05-13 ENCOUNTER — Encounter: Payer: Self-pay | Admitting: Gastroenterology

## 2022-05-13 VITALS — BP 116/56 | HR 52 | Temp 98.0°F | Resp 17 | Ht 62.0 in | Wt 164.6 lb

## 2022-05-13 DIAGNOSIS — D123 Benign neoplasm of transverse colon: Secondary | ICD-10-CM

## 2022-05-13 DIAGNOSIS — D120 Benign neoplasm of cecum: Secondary | ICD-10-CM | POA: Diagnosis not present

## 2022-05-13 DIAGNOSIS — Z86010 Personal history of colonic polyps: Secondary | ICD-10-CM

## 2022-05-13 DIAGNOSIS — Z09 Encounter for follow-up examination after completed treatment for conditions other than malignant neoplasm: Secondary | ICD-10-CM | POA: Diagnosis not present

## 2022-05-13 DIAGNOSIS — Z800 Family history of malignant neoplasm of digestive organs: Secondary | ICD-10-CM

## 2022-05-13 DIAGNOSIS — D122 Benign neoplasm of ascending colon: Secondary | ICD-10-CM

## 2022-05-13 MED ORDER — SODIUM CHLORIDE 0.9 % IV SOLN: 500.0000 mL | Freq: Once | INTRAVENOUS | Status: DC

## 2022-05-13 NOTE — Progress Notes (Signed)
History and Physical:  This patient presents for endoscopic testing for: Encounter Diagnosis  Name Primary?   Hx of colonic polyps Yes    77m TA Feb 2019, mother with CRC Patient denies chronic abdominal pain, rectal bleeding, constipation or diarrhea.   Patient is otherwise without complaints or active issues today.   Past Medical History: Past Medical History:  Diagnosis Date   Allergy    Arthritis    Cataract    Chronic kidney disease (CKD), stage III (moderate) (HCC)    Colon polyps    benign   GERD (gastroesophageal reflux disease)    Hyperlipidemia    Hypertension    Osteoporosis    Thyroid disorder      Past Surgical History: Past Surgical History:  Procedure Laterality Date   cervical cone biospy  1997   benign   COLONOSCOPY     COLONOSCOPY W/ BIOPSIES  2005, 2013   OTHER SURGICAL HISTORY     pt. denies having this   POLYPECTOMY     TUBAL LIGATION     UPPER GASTROINTESTINAL ENDOSCOPY      Allergies: No Known Allergies  Outpatient Meds: Current Outpatient Medications  Medication Sig Dispense Refill   Allopurinol 200 MG TABS      amLODipine (NORVASC) 2.5 MG tablet Take 1 tablet by mouth daily.     atorvastatin (LIPITOR) 40 MG tablet Take 40 mg by mouth daily.     furosemide (LASIX) 20 MG tablet Take 20 mg by mouth daily.     levothyroxine (SYNTHROID, LEVOTHROID) 100 MCG tablet Take 100 mcg by mouth daily before breakfast.     metoprolol tartrate (LOPRESSOR) 50 MG tablet Take 50 mg by mouth 2 (two) times daily.     Multiple Vitamin (MULTIVITAMIN) tablet Take 1 tablet by mouth daily.     ondansetron (ZOFRAN) 4 MG tablet Take 1 tablet (4 mg total) by mouth every 8 (eight) hours as needed for nausea or vomiting. 30 tablet 1   valsartan (DIOVAN) 320 MG tablet Take 320 mg by mouth daily.     XIIDRA 5 % SOLN Apply 1 drop to eye 2 (two) times daily.     acetaminophen (TYLENOL) 500 MG tablet Take 500 mg by mouth every 6 (six) hours as needed.     cetirizine  (ZYRTEC) 10 MG tablet Take 10 mg by mouth as needed for allergies.     cholecalciferol (VITAMIN D) 1000 units tablet Take 1,000 Units by mouth daily. (Patient not taking: Reported on 04/17/2022)     Diclofenac Sodium (PENNSAID TD) Place onto the skin. Knee pain This med is liquid     diclofenac Sodium (VOLTAREN) 1 % GEL Apply topically.     lisinopril-hydrochlorothiazide (PRINZIDE,ZESTORETIC) 20-12.5 MG tablet Take 1 tablet by mouth daily.     ranitidine (ZANTAC) 150 MG tablet Take 150 mg by mouth daily as needed for heartburn.     Current Facility-Administered Medications  Medication Dose Route Frequency Provider Last Rate Last Admin   0.9 %  sodium chloride infusion  500 mL Intravenous Once Danis, HEstill CottaIII, MD          ___________________________________________________________________ Objective   Exam:  BP (!) 143/63   Pulse (!) 56   Temp 98 F (36.7 C) (Temporal)   Ht '5\' 2"'$  (1.575 m)   Wt 164 lb 9.6 oz (74.7 kg)   SpO2 96%   BMI 30.11 kg/m   CV: regular , S1/S2 Resp: clear to auscultation bilaterally, normal RR and effort  noted GI: soft, no tenderness, with active bowel sounds.   Assessment: Encounter Diagnosis  Name Primary?   Hx of colonic polyps Yes     Plan: Colonoscopy  The benefits and risks of the planned procedure were described in detail with the patient or (when appropriate) their health care proxy.  Risks were outlined as including, but not limited to, bleeding, infection, perforation, adverse medication reaction leading to cardiac or pulmonary decompensation, pancreatitis (if ERCP).  The limitation of incomplete mucosal visualization was also discussed.  No guarantees or warranties were given.    The patient is appropriate for an endoscopic procedure in the ambulatory setting.   - Wilfrid Lund, MD

## 2022-05-13 NOTE — Progress Notes (Signed)
A and O x3. Report to RN. Tolerated MAC anesthesia well. 

## 2022-05-13 NOTE — Op Note (Signed)
[facility] Patient Name: Kamalpreet Seman Procedure Date: 05/13/2022 9:14 AM MRN: [id number] Endoscopist: [place]. Loletha Carrow , MD, [id number] Age: 77 Referring MD:  Date of Birth: [birth year] Gender: Female Account #: [id number] Procedure:                Colonoscopy Indications:              Colon cancer screening in patient at increased                            risk: Colorectal cancer in mother, Surveillance:                            Personal history of adenomatous polyps on last                            colonoscopy 5 years ago                           68m TA Feb 2019 Medicines:                Monitored Anesthesia Care Procedure:                Pre-Anesthesia Assessment:                           - Prior to the procedure, a History and Physical                            was performed, and patient medications and                            allergies were reviewed. The patient's tolerance of                            previous anesthesia was also reviewed. The risks                            and benefits of the procedure and the sedation                            options and risks were discussed with the patient.                            All questions were answered, and informed consent                            was obtained. Prior Anticoagulants: The patient has                            taken no anticoagulant or antiplatelet agents. ASA                            Grade Assessment: II - A patient with mild systemic  disease. After reviewing the risks and benefits,                            the patient was deemed in satisfactory condition to                            undergo the procedure.                           After obtaining informed consent, the colonoscope                            was passed under direct vision. Throughout the                            procedure, the patient's blood pressure, pulse, and                             oxygen saturations were monitored continuously. The                            CF [id number] was introduced through the anus                            and advanced to the the cecum, identified by                            appendiceal orifice and ileocecal valve. The                            colonoscopy was somewhat difficult due to a                            redundant colon. Successful completion of the                            procedure was aided by using manual pressure and                            straightening and shortening the scope to obtain                            bowel loop reduction. The patient tolerated the                            procedure well. The quality of the bowel                            preparation was good in most areas, fair in others                            (primarily right colon) with scattered opaque  liquid and fibrous debris - lavage performed. The                            ileocecal valve, appendiceal orifice, and rectum                            were photographed. The bowel preparation used was                            SUPREP via split dose instruction. Scope In: 9:23:31 AM Scope Out: 9:41:41 AM Scope Withdrawal Time: 0 hours 14 minutes 22 seconds  Total Procedure Duration: 0 hours 18 minutes 10 seconds  Findings:                 The perianal and digital rectal examinations were                            normal.                           Four flat and sessile polyps were found in the                            ascending colon and cecum. The polyps were                            diminutive in size. These polyps were removed with                            a cold snare. Resection and retrieval were complete.                           A diminutive polyp was found in the proximal                            transverse colon. The polyp was semi-sessile. The                            polyp was removed with a  cold snare. Resection and                            retrieval were complete.                           Repeat examination of right colon under NBI                            performed.                           Multiple diverticula were found in the left colon.                           The exam was otherwise without abnormality on  direct and retroflexion views. Complications:            No immediate complications. Estimated Blood Loss:     Estimated blood loss was minimal. Impression:               - Four diminutive polyps in the ascending colon and                            in the cecum, removed with a cold snare. Resected                            and retrieved.                           - One diminutive polyp in the proximal transverse                            colon, removed with a cold snare. Resected and                            retrieved.                           - Diverticulosis in the left colon.                           - The examination was otherwise normal on direct                            and retroflexion views. Recommendation:           - Patient has a contact number available for                            emergencies. The signs and symptoms of potential                            delayed complications were discussed with the                            patient. Return to normal activities tomorrow.                            Written discharge instructions were provided to the                            patient.                           - Resume previous diet.                           - Continue present medications.                           - Await pathology results.                           -  Repeat colonoscopy in 3 years for surveillance.                            Golytely prep for next exam Jasper Ruminski L. Loletha Carrow, MD 05/13/2022 9:47:33 AM This report has been signed electronically.

## 2022-05-13 NOTE — Progress Notes (Signed)
Pt's states no medical or surgical changes since previsit or office visit. 

## 2022-05-13 NOTE — Progress Notes (Signed)
Called to room to assist during endoscopic procedure.  Patient ID and intended procedure confirmed with present staff. Received instructions for my participation in the procedure from the performing physician.  

## 2022-05-13 NOTE — Patient Instructions (Addendum)
Recommendation:  - Patient has a contact number available for                            emergencies. The signs and symptoms of potential                            delayed complications were discussed with the                            patient. Return to normal activities tomorrow.                            Written discharge instructions were provided to the                            patient.                           - Resume previous diet.                           - Continue present medications.                           - Await pathology results.                           - Repeat colonoscopy in 3 years for surveillance.                            Golytely prep for next exam  Handouts on polyps and diverticulosis given.  YOU HAD AN ENDOSCOPIC PROCEDURE TODAY AT [facility]:   Refer to the procedure report that was given to you for any specific questions about what was found during the examination.  If the procedure report does not answer your questions, please call your gastroenterologist to clarify.  If you requested that your care partner not be given the details of your procedure findings, then the procedure report has been included in a sealed envelope for you to review at your convenience later.  YOU SHOULD EXPECT: Some feelings of bloating in the abdomen. Passage of more gas than usual.  Walking can help get rid of the air that was put into your GI tract during the procedure and reduce the bloating. If you had a lower endoscopy (such as a colonoscopy or flexible sigmoidoscopy) you may notice spotting of blood in your stool or on the toilet paper. If you underwent a bowel prep for your procedure, you may not have a normal bowel movement for a few days.  Please Note:  You might notice some irritation and congestion in your nose or some drainage.  This is from the oxygen used during your procedure.  There is no need for concern and it should clear up in a day or  so.  SYMPTOMS TO REPORT IMMEDIATELY:  Following lower endoscopy (colonoscopy or flexible sigmoidoscopy):  Excessive amounts of blood in the stool  Significant tenderness or worsening of abdominal pains  Swelling of the abdomen that is new, acute  Fever of 100F or higher  For urgent or emergent issues, a gastroenterologist can be reached at any hour by calling [phone]. Do not use MyChart messaging for urgent concerns.   DIET:  We do recommend a small meal at first, but then you may proceed to your regular diet.  Drink plenty of fluids but you should avoid alcoholic beverages for 24 hours.  ACTIVITY:  You should plan to take it easy for the rest of today and you should NOT DRIVE or use heavy machinery until tomorrow (because of the sedation medicines used during the test).    FOLLOW UP: Our staff will call the number listed on your records the next business day following your procedure.  We will call around 7:15- 8:00 am to check on you and address any questions or concerns that you may have regarding the information given to you following your procedure. If we do not reach you, we will leave a message.     If any biopsies were taken you will be contacted by phone or by letter within the next 1-3 weeks.  Please call us at [phone] if you have not heard about the biopsies in 3 weeks.   SIGNATURES/CONFIDENTIALITY: You and/or your care partner have signed paperwork which will be entered into your electronic medical record.  These signatures attest to the fact that that the information above on your After Visit Summary has been reviewed and is understood.  Full responsibility of the confidentiality of this discharge information lies with you and/or your care-partner.

## 2022-05-14 ENCOUNTER — Telehealth: Payer: Self-pay | Admitting: *Deleted

## 2022-05-14 NOTE — Telephone Encounter (Signed)
Post procedure follow up phone call. No answer at number given.  Left message on voicemail.  

## 2022-05-20 ENCOUNTER — Encounter: Payer: Self-pay | Admitting: Gastroenterology

## 2022-07-25 ENCOUNTER — Other Ambulatory Visit: Payer: Self-pay | Admitting: Physician Assistant

## 2022-07-25 DIAGNOSIS — Z1231 Encounter for screening mammogram for malignant neoplasm of breast: Secondary | ICD-10-CM

## 2022-07-30 ENCOUNTER — Other Ambulatory Visit: Payer: Self-pay | Admitting: Physician Assistant

## 2022-07-30 DIAGNOSIS — Z780 Asymptomatic menopausal state: Secondary | ICD-10-CM

## 2022-08-01 ENCOUNTER — Ambulatory Visit
Admission: RE | Admit: 2022-08-01 | Discharge: 2022-08-01 | Disposition: A | Discharge status: Home / Self Care | Payer: Medicare Other | Source: Ambulatory Visit | Attending: Physician Assistant | Admitting: Physician Assistant

## 2022-08-01 DIAGNOSIS — Z780 Asymptomatic menopausal state: Secondary | ICD-10-CM

## 2022-08-12 ENCOUNTER — Ambulatory Visit
Admission: RE | Admit: 2022-08-12 | Discharge: 2022-08-12 | Disposition: A | Discharge status: Home / Self Care | Payer: Medicare Other | Source: Ambulatory Visit | Attending: Physician Assistant | Admitting: Physician Assistant

## 2022-08-12 DIAGNOSIS — Z1231 Encounter for screening mammogram for malignant neoplasm of breast: Secondary | ICD-10-CM

## 2023-07-20 ENCOUNTER — Other Ambulatory Visit: Payer: Self-pay | Admitting: Physician Assistant

## 2023-07-20 DIAGNOSIS — Z1231 Encounter for screening mammogram for malignant neoplasm of breast: Secondary | ICD-10-CM

## 2023-08-13 ENCOUNTER — Ambulatory Visit
Admission: RE | Admit: 2023-08-13 | Discharge: 2023-08-13 | Disposition: A | Discharge status: Home / Self Care | Source: Ambulatory Visit | Attending: Physician Assistant | Admitting: Physician Assistant

## 2023-08-13 DIAGNOSIS — Z1231 Encounter for screening mammogram for malignant neoplasm of breast: Secondary | ICD-10-CM

## 2023-08-18 ENCOUNTER — Other Ambulatory Visit: Payer: Self-pay | Admitting: Physician Assistant

## 2023-08-18 DIAGNOSIS — R928 Other abnormal and inconclusive findings on diagnostic imaging of breast: Secondary | ICD-10-CM

## 2023-08-26 ENCOUNTER — Ambulatory Visit
Admission: RE | Admit: 2023-08-26 | Discharge: 2023-08-26 | Disposition: A | Discharge status: Home / Self Care | Source: Ambulatory Visit | Attending: Physician Assistant | Admitting: Physician Assistant

## 2023-08-26 ENCOUNTER — Ambulatory Visit

## 2023-08-26 DIAGNOSIS — R928 Other abnormal and inconclusive findings on diagnostic imaging of breast: Secondary | ICD-10-CM
